# Patient Record
Sex: Male | Born: 1945 | Race: White | Hispanic: No | Marital: Married | Smoking: Former smoker
Health system: Southern US, Community
[De-identification: ages and names within clinical notes are randomized; demographics above are authoritative.]

## PROBLEM LIST (undated history)

## (undated) DIAGNOSIS — F329 Major depressive disorder, single episode, unspecified: Secondary | ICD-10-CM

## (undated) DIAGNOSIS — F32A Depression, unspecified: Secondary | ICD-10-CM

## (undated) DIAGNOSIS — N400 Enlarged prostate without lower urinary tract symptoms: Secondary | ICD-10-CM

## (undated) DIAGNOSIS — E079 Disorder of thyroid, unspecified: Secondary | ICD-10-CM

## (undated) DIAGNOSIS — K219 Gastro-esophageal reflux disease without esophagitis: Secondary | ICD-10-CM

## (undated) DIAGNOSIS — E119 Type 2 diabetes mellitus without complications: Secondary | ICD-10-CM

## (undated) DIAGNOSIS — M109 Gout, unspecified: Secondary | ICD-10-CM

## (undated) DIAGNOSIS — I639 Cerebral infarction, unspecified: Secondary | ICD-10-CM

## (undated) HISTORY — PX: BELOW KNEE LEG AMPUTATION: SUR23

## (undated) HISTORY — PX: CARDIAC SURGERY: SHX584

---

## 2008-09-10 ENCOUNTER — Inpatient Hospital Stay: Payer: Self-pay | Admitting: Vascular Surgery

## 2008-09-26 ENCOUNTER — Inpatient Hospital Stay: Payer: Self-pay | Admitting: Internal Medicine

## 2008-10-09 ENCOUNTER — Inpatient Hospital Stay: Payer: Self-pay | Admitting: Vascular Surgery

## 2008-10-23 ENCOUNTER — Ambulatory Visit: Payer: Self-pay | Admitting: Oncology

## 2008-11-22 ENCOUNTER — Ambulatory Visit: Payer: Self-pay | Admitting: Oncology

## 2008-12-02 ENCOUNTER — Ambulatory Visit: Payer: Self-pay | Admitting: Vascular Surgery

## 2008-12-17 ENCOUNTER — Ambulatory Visit: Payer: Self-pay | Admitting: Family Medicine

## 2009-02-16 ENCOUNTER — Inpatient Hospital Stay: Payer: Self-pay | Admitting: Internal Medicine

## 2009-03-23 ENCOUNTER — Inpatient Hospital Stay: Payer: Self-pay | Admitting: Internal Medicine

## 2009-04-09 ENCOUNTER — Inpatient Hospital Stay: Payer: Self-pay | Admitting: Internal Medicine

## 2009-05-09 ENCOUNTER — Ambulatory Visit: Payer: Self-pay | Admitting: Vascular Surgery

## 2009-05-16 ENCOUNTER — Ambulatory Visit: Payer: Self-pay | Admitting: Vascular Surgery

## 2009-05-19 ENCOUNTER — Inpatient Hospital Stay: Payer: Self-pay | Admitting: Vascular Surgery

## 2009-08-26 ENCOUNTER — Emergency Department: Payer: Self-pay | Admitting: Emergency Medicine

## 2009-09-12 ENCOUNTER — Emergency Department: Payer: Self-pay | Admitting: Emergency Medicine

## 2009-09-15 ENCOUNTER — Inpatient Hospital Stay: Payer: Self-pay | Admitting: Vascular Surgery

## 2009-10-18 ENCOUNTER — Ambulatory Visit: Payer: Self-pay | Admitting: Internal Medicine

## 2009-12-03 IMAGING — CT CT EXTREM LOW W/O CM*L*
2 of 3 series · 14 of 42 positions shown, 18 images · non-contrast
Comparison: None

REASON FOR EXAM: left groin infection s/p ileofemoral bypass
COMMENTS:

PROCEDURE:     CT  - CT FEMUR/ THIGH LEFT WO  - October 10, 2008 [DATE]
RESULT:     History: Right groin infection
TECHNIQUE: Multiple axial images are obtained of the pelvis and proximal two
thirds of the left femur without intravenous contrast. Sagittal and coronal
reformatted images are provided.

[Series 1: coronal femur · coronal · 0.99mm/px · 3 of 95 slices shown]
[im 32/95  soft-tissue]
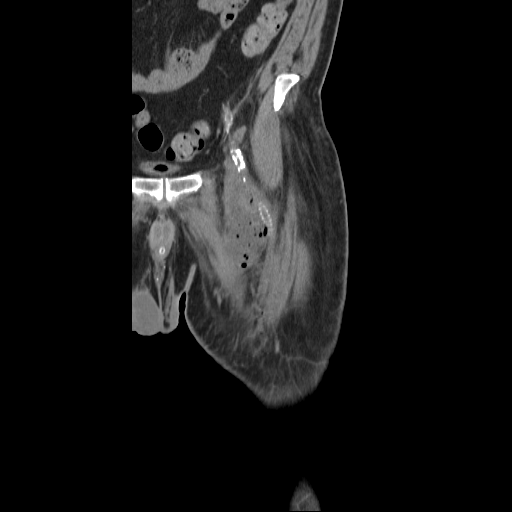
[im 42/95  soft-tissue]
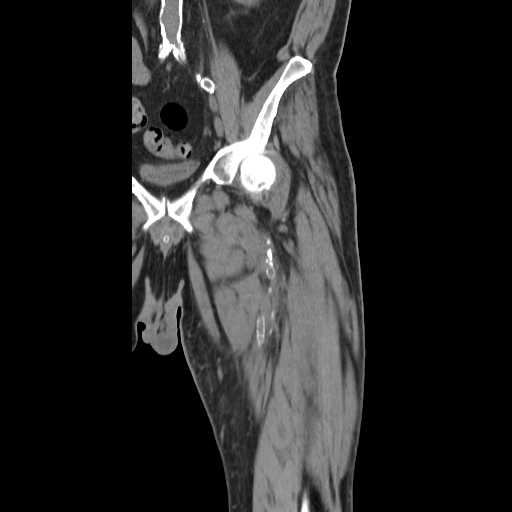
[im 53/95  soft-tissue]
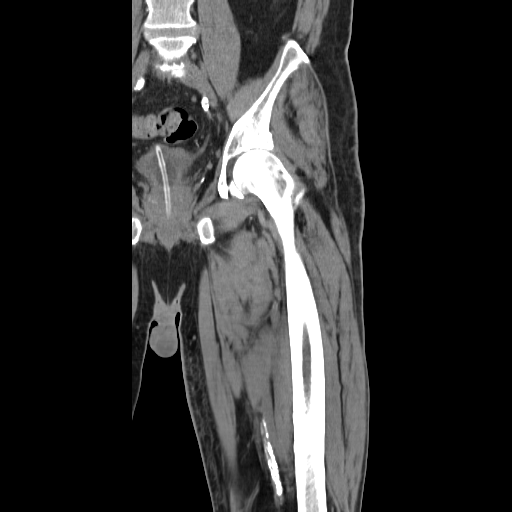

[Series 2: webspace femur left · axial · 0.48mm/px · z∈[-853,-412]mm · 11 of 338 slices shown, 15 images]
[im 29/338  soft-tissue]
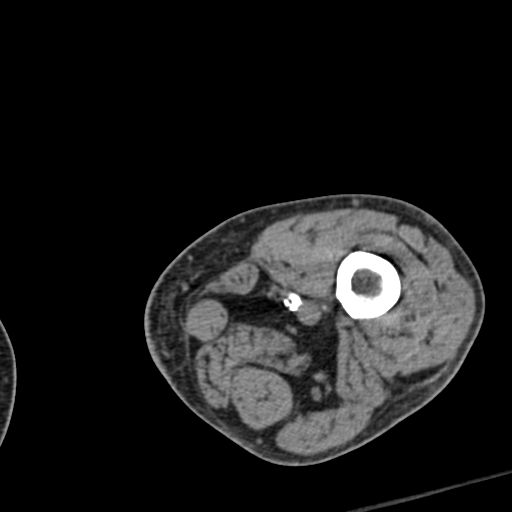
[im 29/338  bone]
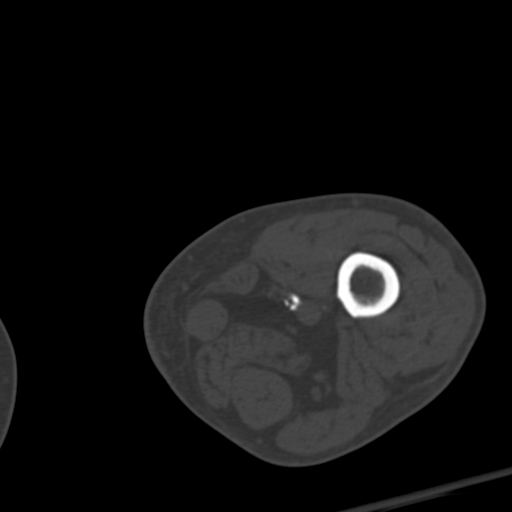
[im 57/338  soft-tissue]
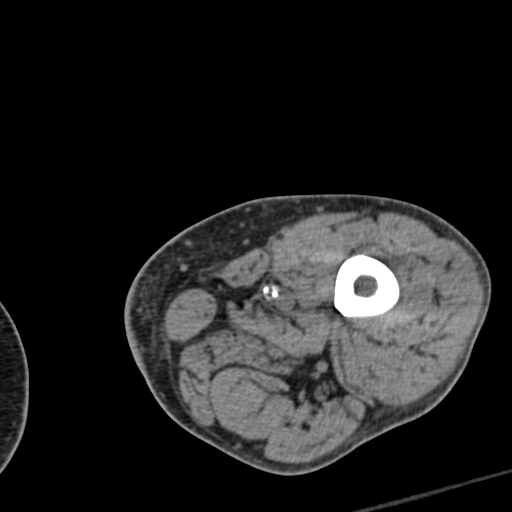
[im 99/338  soft-tissue]
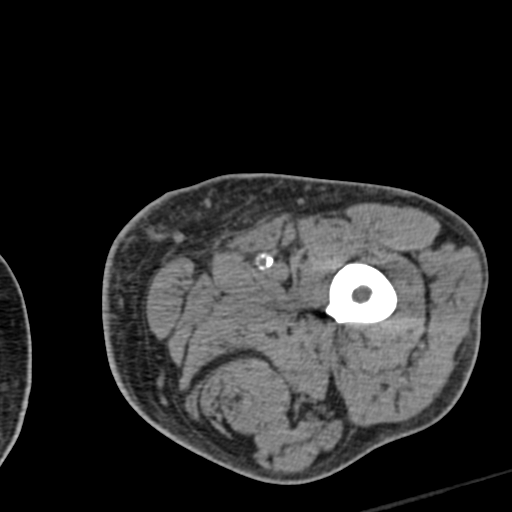
[im 127/338  soft-tissue]
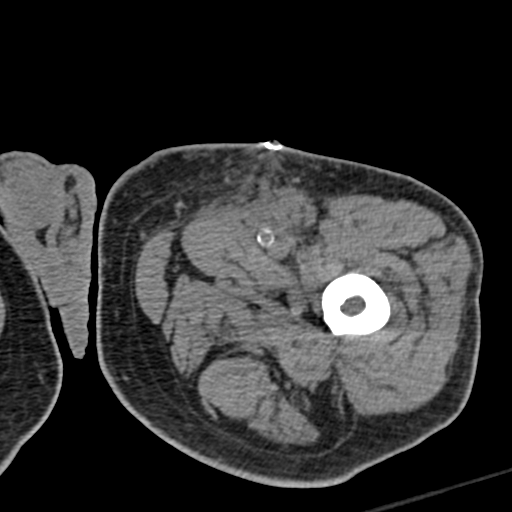
[im 169/338  soft-tissue]
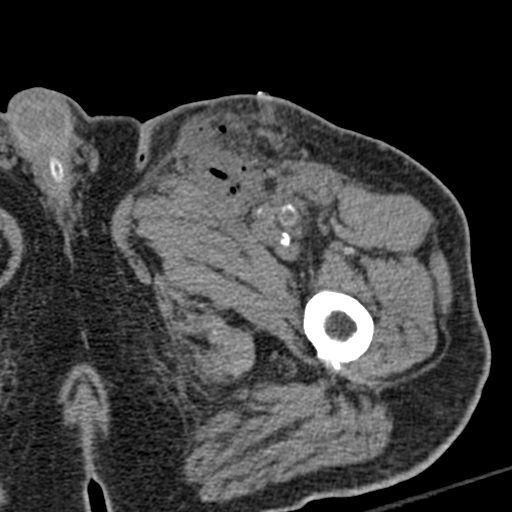
[im 211/338  soft-tissue]
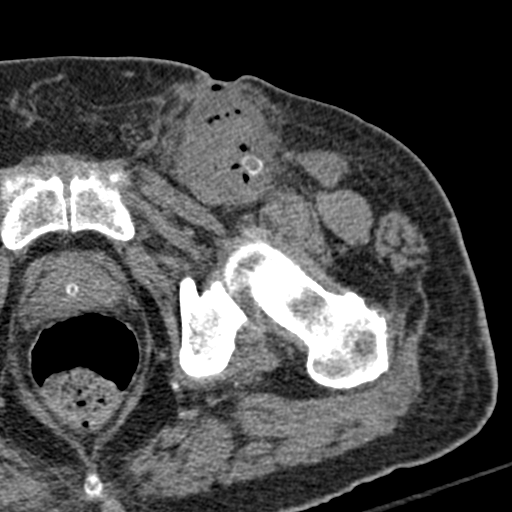
[im 239/338  soft-tissue]
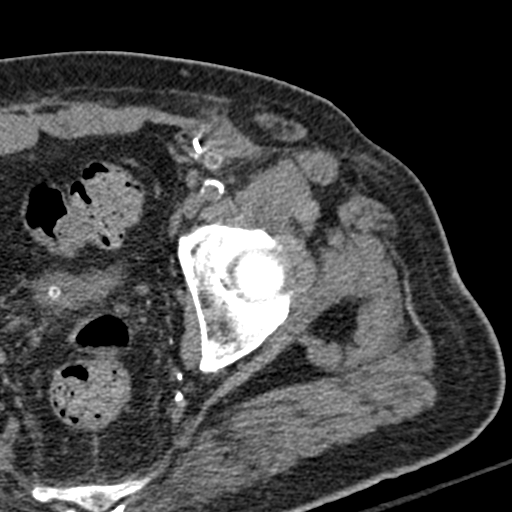
[im 281/338  soft-tissue]
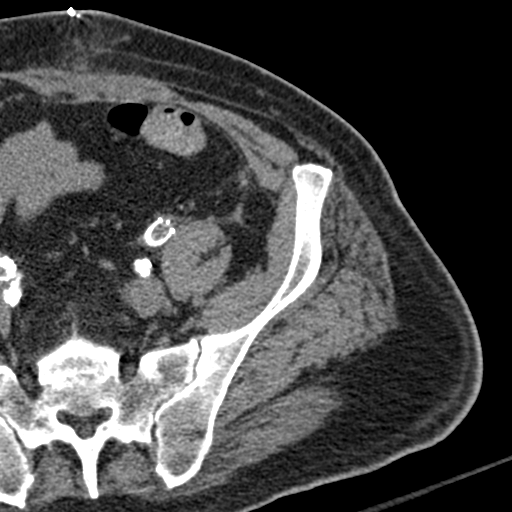
[im 281/338  lung]
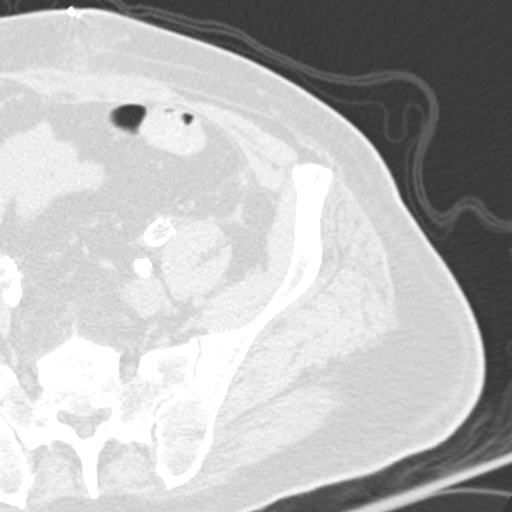
[im 295/338  lung]
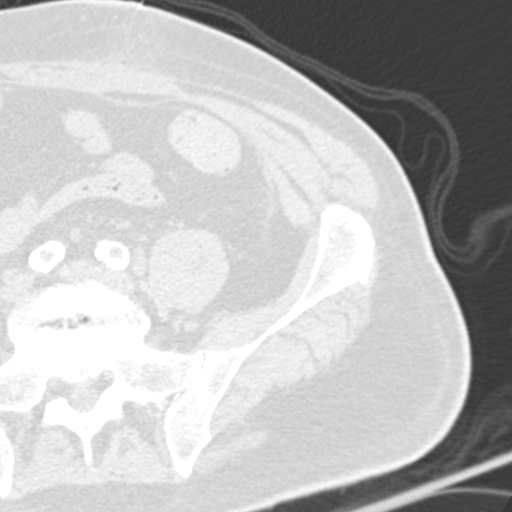
[im 309/338  soft-tissue]
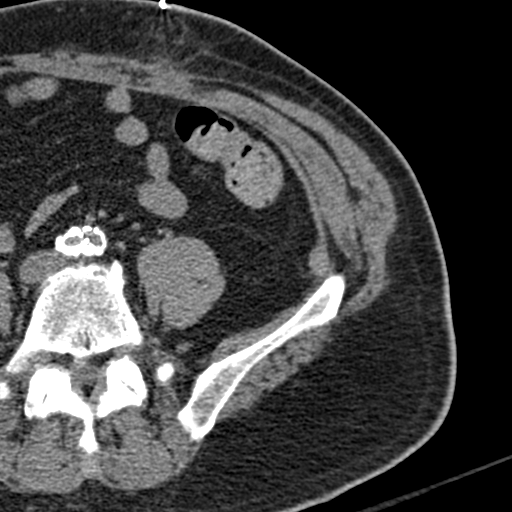
[im 309/338  lung]
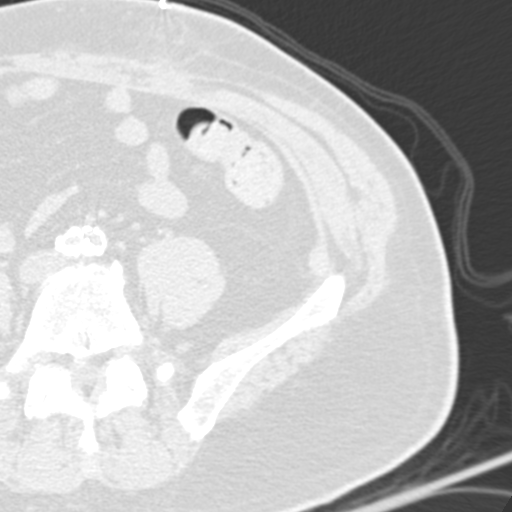
[im 309/338  bone]
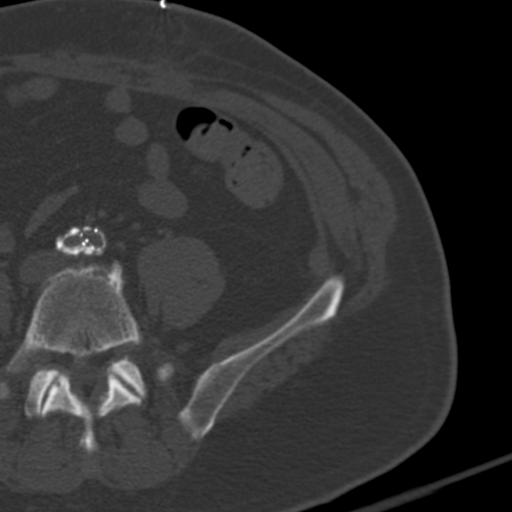
[im 323/338  lung]
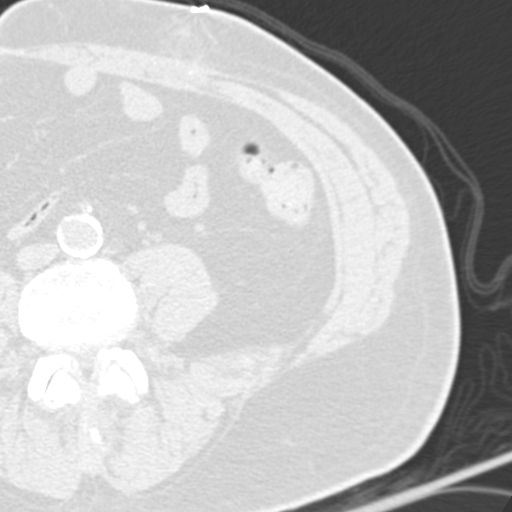

[14 of 42 positions shown; findings below may reference images not displayed]

FINDINGS: There is a 12.3 x 5.8 x 6.4 cm complex fluid collection within the left
groin extending inferiorly to just below the level of the lesser trochanter.
The area of concern surrounds the left femoral bypass graft. There is air
within the collection. There is an overlying skin defect. There mild
surrounding inflammatory changes. There is inflammatory change extending
inferiorly along the right anterior thigh with a few locules of air seen
within the subcutaneous fat superficial to the fascia . There is air seen
within the inferior portion of the left rectus muscle.

There is abdominal aortic atherosclerosis. There is bilateral common iliac
artery atherosclerosis. There is right external iliac artery stenting. There
are postsurgical changes in the region of the right groin without a focal
fluid collection. There is a right femoral bypass graft noted.
The osseous structures are unremarkable. There is no bone destruction. There
is lower lumbar spine spondylosis. There are bilateral mild degenerative
changes of the hips.

There is a Foley catheter present within the bladder.
IMPRESSION: 1. There is a 12.3 x 5.8 x 6.4 cm complex fluid collection within the left
groin extending inferiorly to just below the level of the lesser trochanter.
The area of concern surrounds the left femoral bypass graft. There is air
within the collection. There is an overlying skin defect. The overall
appearance is concerning for an abscess.

2. There is a right femoral bypass graft noted. There are inflammatory
changes in the right groin likely postsurgical changes. There is no right
groin fluid collection to suggest an abscess.

## 2010-02-19 ENCOUNTER — Inpatient Hospital Stay: Payer: Self-pay | Admitting: Internal Medicine

## 2010-05-18 IMAGING — CR RIGHT FOOT COMPLETE - 3+ VIEW
1 series · 3 of 3 positions shown · non-contrast
Comparison: none

REASON FOR EXAM: ulcer, PVD, possible osteomyelitis
COMMENTS:

PROCEDURE:     DXR - DXR FOOT RT COMPLETE W/OBLIQUES  - March 25, 2009  [DATE]
RESULT:     There is no evidence of fracture, dislocation or malalignment.
There does not appear to be evidence of subcutaneous emphysema. There is no
evidence of cortical irregularity.

[Series 1: view not recorded · 0.17mm/px · 3 of 3 slices shown]
[im 1/3]
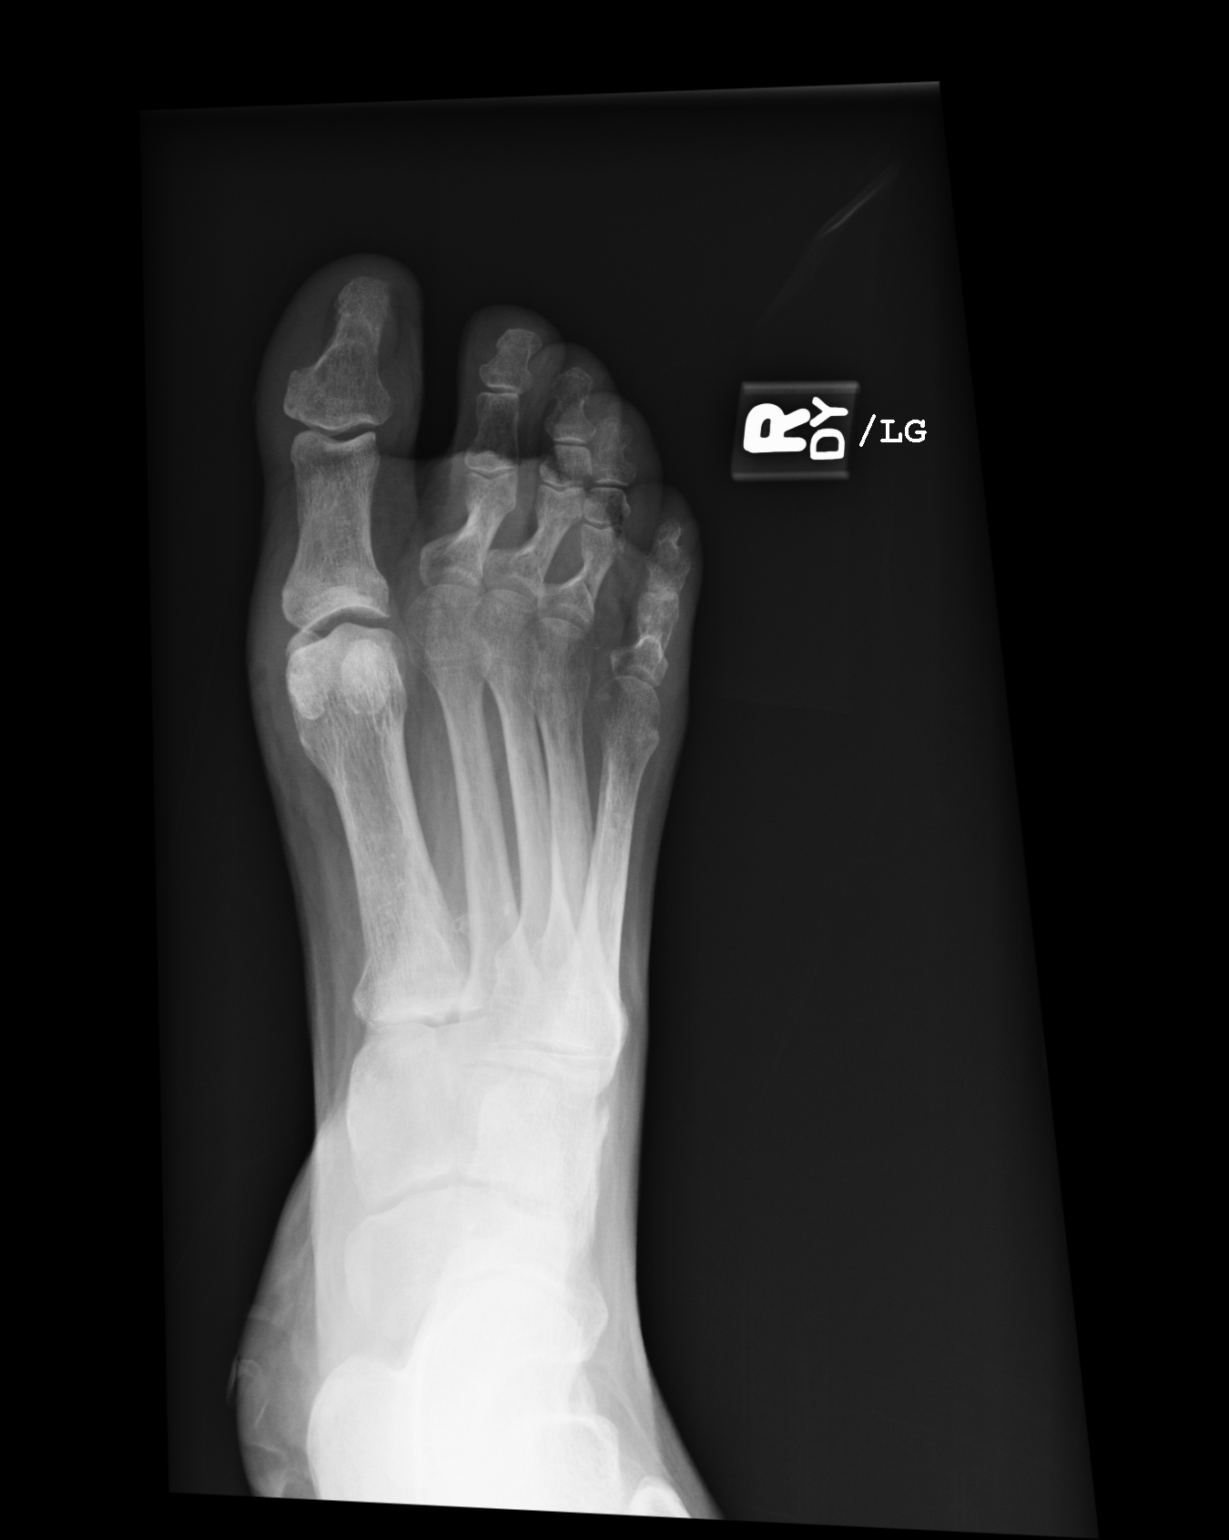
[im 2/3]
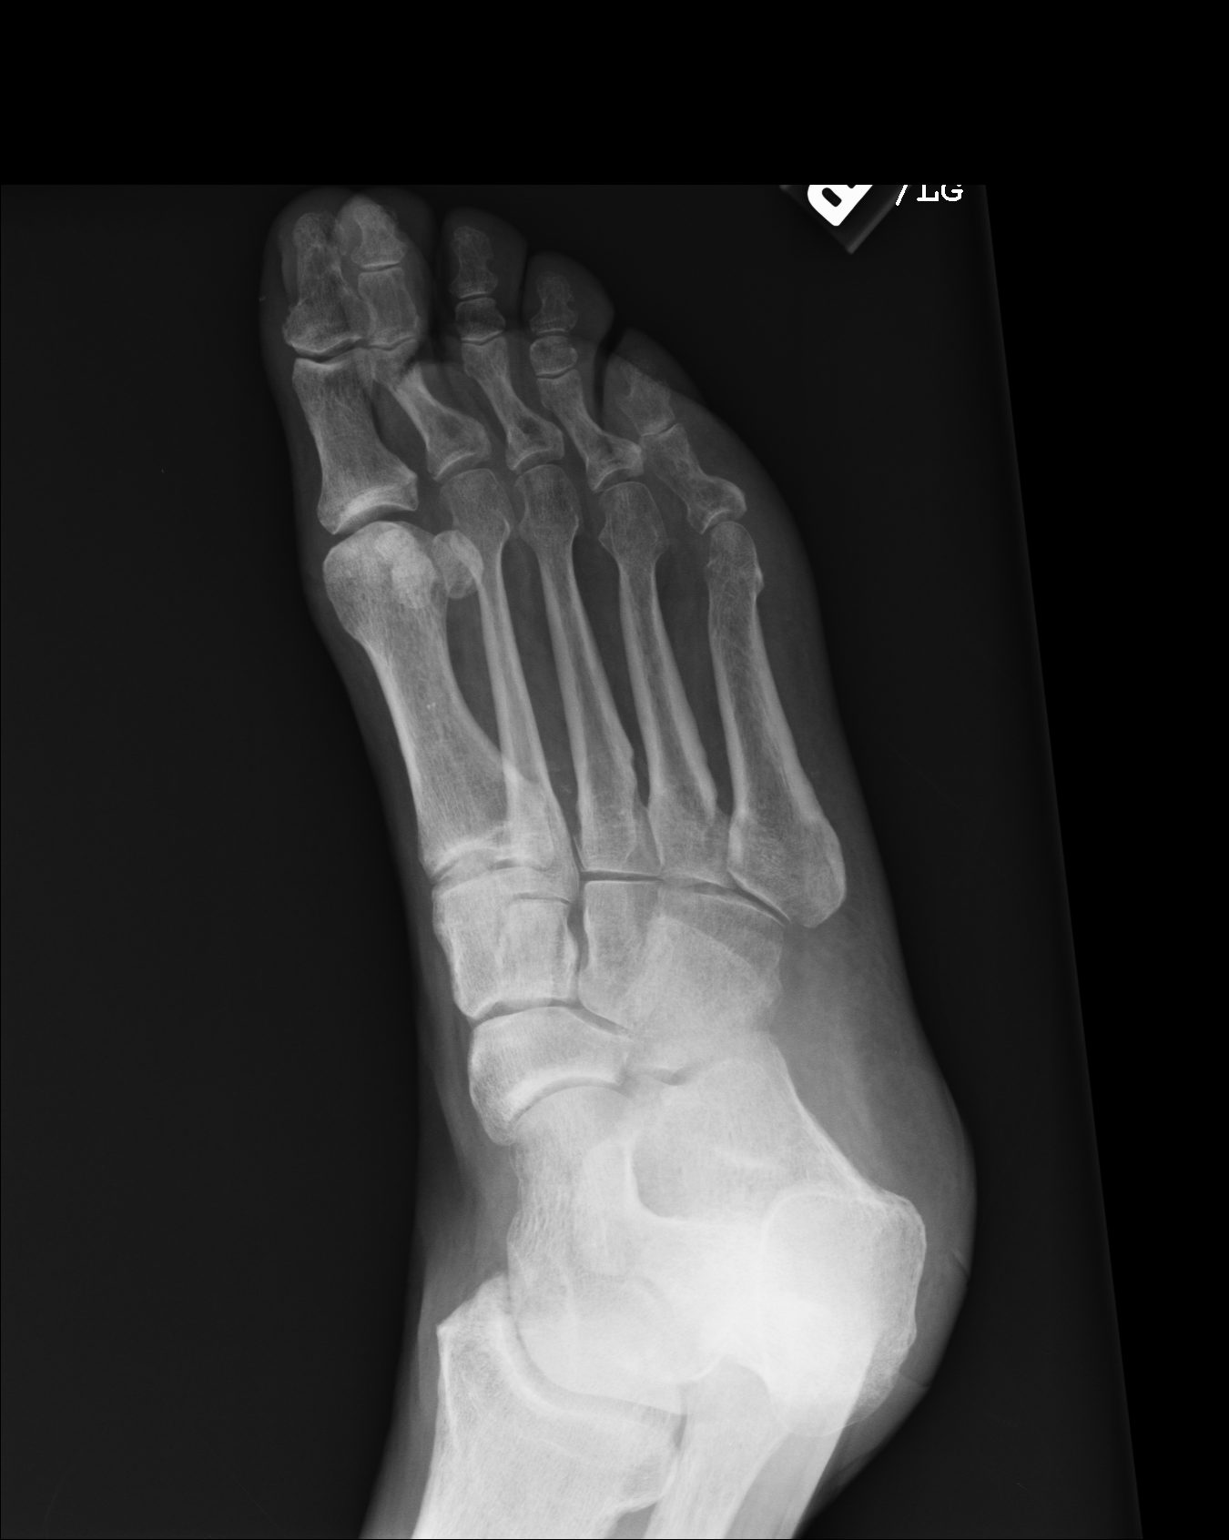
[im 3/3]
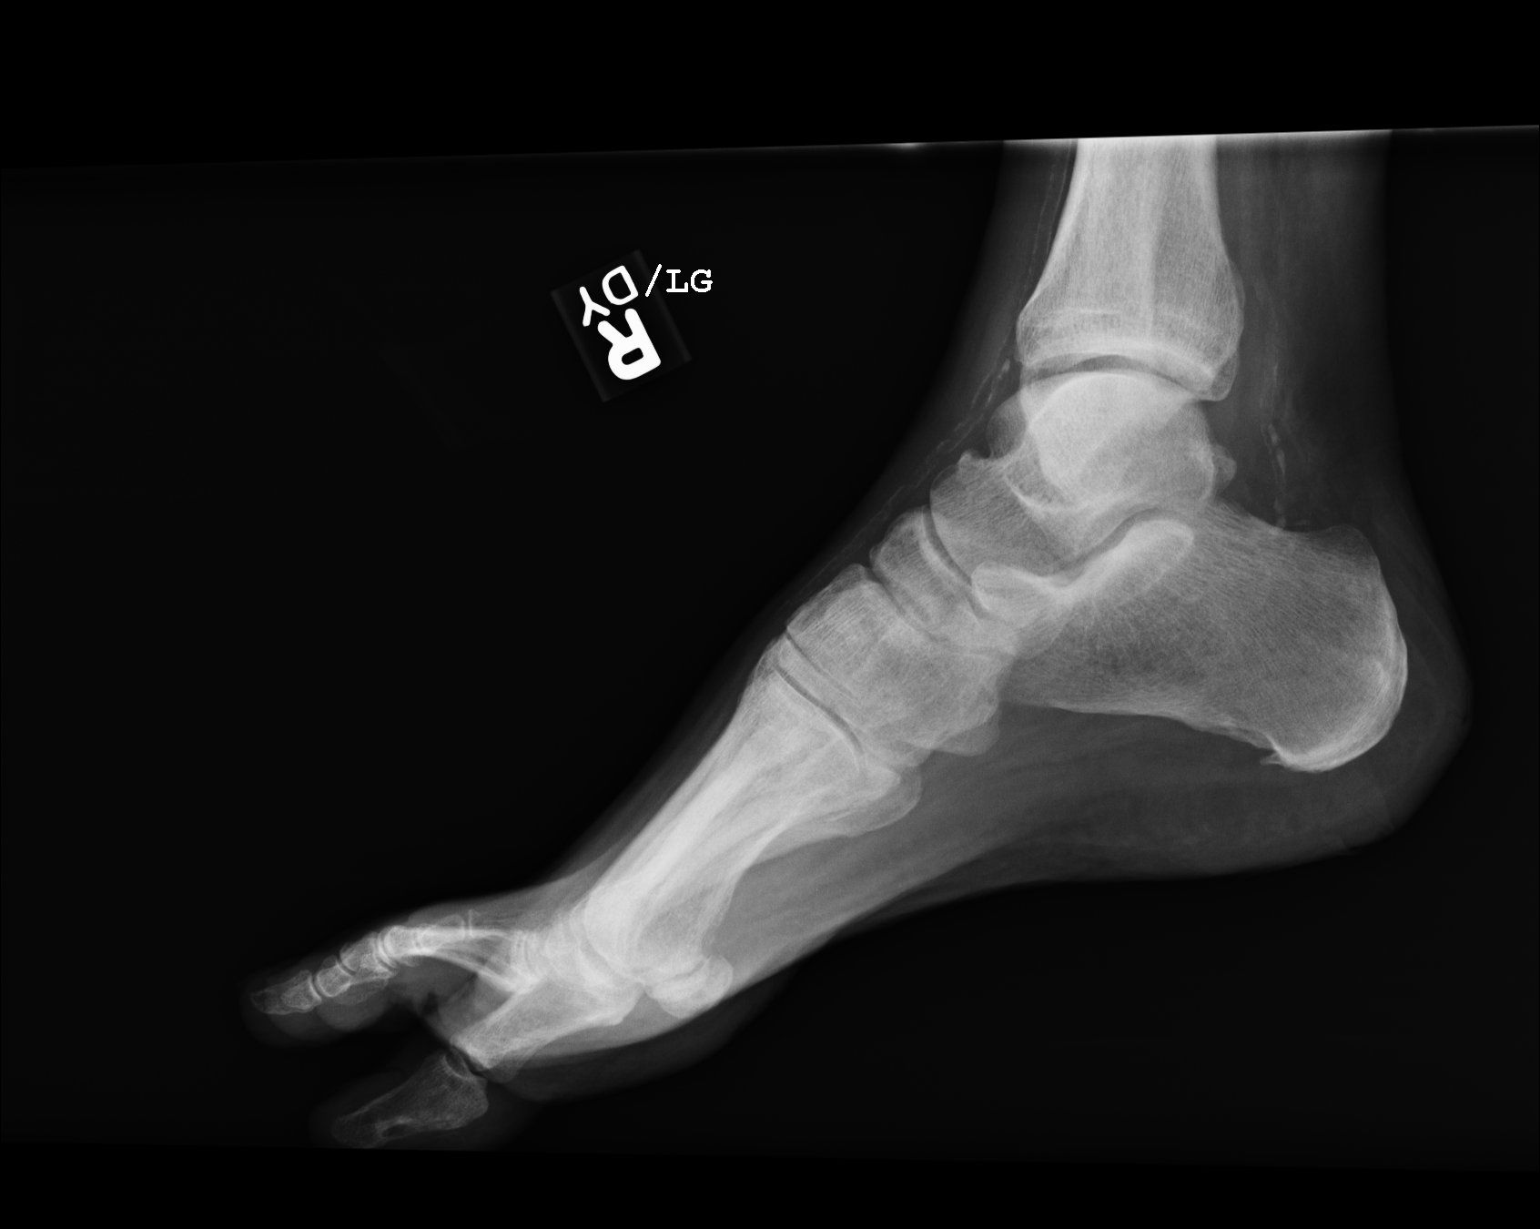

[3 of 3 positions shown; findings below may reference images not displayed]

IMPRESSION: No evidence of acute osseous abnormalities. If there is persistent clinical
concern, further evaluation with MRI is recommended.

## 2010-07-30 ENCOUNTER — Inpatient Hospital Stay: Payer: Self-pay | Admitting: Internal Medicine

## 2012-04-04 ENCOUNTER — Other Ambulatory Visit: Payer: Self-pay

## 2012-04-04 LAB — PROTIME-INR: INR: 1.8

## 2012-04-11 ENCOUNTER — Other Ambulatory Visit: Payer: Self-pay

## 2012-04-11 LAB — PROTIME-INR: Prothrombin Time: 23.7 secs — ABNORMAL HIGH (ref 11.5–14.7)

## 2012-06-12 ENCOUNTER — Emergency Department: Payer: Self-pay | Admitting: Emergency Medicine

## 2012-06-12 LAB — COMPREHENSIVE METABOLIC PANEL
Albumin: 3.1 g/dL — ABNORMAL LOW (ref 3.4–5.0)
BUN: 47 mg/dL — ABNORMAL HIGH (ref 7–18)
Bilirubin,Total: 2.5 mg/dL — ABNORMAL HIGH (ref 0.2–1.0)
Calcium, Total: 8.5 mg/dL (ref 8.5–10.1)
Co2: 25 mmol/L (ref 21–32)
Creatinine: 1.61 mg/dL — ABNORMAL HIGH (ref 0.60–1.30)
EGFR (Non-African Amer.): 44 — ABNORMAL LOW
Potassium: 4.4 mmol/L (ref 3.5–5.1)
Sodium: 139 mmol/L (ref 136–145)

## 2012-06-12 LAB — TSH: Thyroid Stimulating Horm: 4.88 u[IU]/mL — ABNORMAL HIGH

## 2012-06-12 LAB — CBC WITH DIFFERENTIAL/PLATELET
Basophil #: 0.1 10*3/uL (ref 0.0–0.1)
Basophil %: 0.9 %
Eosinophil %: 2.6 %
HCT: 33.6 % — ABNORMAL LOW (ref 40.0–52.0)
HGB: 11.4 g/dL — ABNORMAL LOW (ref 13.0–18.0)
Lymphocyte #: 0.8 10*3/uL — ABNORMAL LOW (ref 1.0–3.6)
Lymphocyte %: 10.2 %
MCH: 29.5 pg (ref 26.0–34.0)
MCHC: 33.9 g/dL (ref 32.0–36.0)
Monocyte %: 7.3 %
Neutrophil #: 5.9 10*3/uL (ref 1.4–6.5)
Neutrophil %: 79 %
RDW: 19.7 % — ABNORMAL HIGH (ref 11.5–14.5)

## 2012-06-12 LAB — PRO B NATRIURETIC PEPTIDE: B-Type Natriuretic Peptide: 1894 pg/mL — ABNORMAL HIGH (ref 0–125)

## 2012-06-12 LAB — TROPONIN I: Troponin-I: <0.02 ng/mL

## 2013-10-09 ENCOUNTER — Other Ambulatory Visit: Payer: Self-pay | Admitting: Family Medicine

## 2013-10-09 LAB — URINALYSIS, COMPLETE
Bilirubin,UR: NEGATIVE
Glucose,UR: >=500 mg/dL (ref 0–75)
Ketone: NEGATIVE
NITRITE: NEGATIVE
Ph: 7 (ref 4.5–8.0)
Protein: 100
RBC,UR: 3828 /HPF (ref 0–5)
Specific Gravity: 1.013 (ref 1.003–1.030)
Squamous Epithelial: NONE SEEN
WBC UR: 270 /HPF (ref 0–5)

## 2013-10-12 LAB — URINE CULTURE

## 2014-07-11 ENCOUNTER — Other Ambulatory Visit
Admission: RE | Admit: 2014-07-11 | Discharge: 2014-07-11 | Disposition: A | Discharge status: Home / Self Care | Source: Skilled Nursing Facility | Attending: Physician Assistant | Admitting: Physician Assistant

## 2014-07-11 DIAGNOSIS — I638 Other cerebral infarction: Secondary | ICD-10-CM | POA: Diagnosis not present

## 2014-07-11 LAB — PROTIME-INR
INR: 10.84
PROTHROMBIN TIME: 81.9 s — AB (ref 11.4–15.0)

## 2014-07-14 ENCOUNTER — Other Ambulatory Visit
Admission: RE | Admit: 2014-07-14 | Discharge: 2014-07-14 | Disposition: A | Discharge status: Home / Self Care | Payer: Medicare Other | Source: Ambulatory Visit | Attending: Family Medicine | Admitting: Family Medicine

## 2014-07-14 DIAGNOSIS — Z7902 Long term (current) use of antithrombotics/antiplatelets: Secondary | ICD-10-CM | POA: Diagnosis present

## 2014-07-14 LAB — PROTIME-INR
INR: 1.36
PROTHROMBIN TIME: 17 s — AB (ref 11.4–15.0)

## 2014-07-21 ENCOUNTER — Other Ambulatory Visit
Admission: RE | Admit: 2014-07-21 | Discharge: 2014-07-21 | Disposition: A | Discharge status: Home / Self Care | Payer: Self-pay | Source: Ambulatory Visit | Attending: Physician Assistant | Admitting: Physician Assistant

## 2014-07-21 DIAGNOSIS — Z5181 Encounter for therapeutic drug level monitoring: Secondary | ICD-10-CM | POA: Insufficient documentation

## 2014-07-21 DIAGNOSIS — Z7901 Long term (current) use of anticoagulants: Secondary | ICD-10-CM | POA: Insufficient documentation

## 2014-07-21 LAB — PROTIME-INR
INR: 2.13
Prothrombin Time: 24 seconds — ABNORMAL HIGH (ref 11.4–15.0)

## 2014-10-20 ENCOUNTER — Other Ambulatory Visit
Admission: RE | Admit: 2014-10-20 | Discharge: 2014-10-20 | Disposition: A | Discharge status: Home / Self Care | Source: Ambulatory Visit | Attending: Physician Assistant | Admitting: Physician Assistant

## 2014-10-20 DIAGNOSIS — I4891 Unspecified atrial fibrillation: Secondary | ICD-10-CM | POA: Diagnosis present

## 2014-10-20 LAB — PROTIME-INR
INR: 1.55
PROTHROMBIN TIME: 18.8 s — AB (ref 11.4–15.0)

## 2014-11-19 ENCOUNTER — Other Ambulatory Visit
Admit: 2014-11-19 | Discharge: 2014-11-19 | Disposition: A | Discharge status: Home / Self Care | Source: Skilled Nursing Facility | Attending: Family Medicine | Admitting: Family Medicine

## 2014-11-19 DIAGNOSIS — R300 Dysuria: Secondary | ICD-10-CM | POA: Insufficient documentation

## 2014-11-19 LAB — URINALYSIS COMPLETE WITH MICROSCOPIC (ARMC ONLY)
Bilirubin Urine: NEGATIVE
GLUCOSE, UA: NEGATIVE mg/dL
Ketones, ur: NEGATIVE mg/dL
Nitrite: NEGATIVE
Protein, ur: NEGATIVE mg/dL
SPECIFIC GRAVITY, URINE: 1.006 (ref 1.005–1.030)
SQUAMOUS EPITHELIAL / LPF: NONE SEEN
pH: 8 (ref 5.0–8.0)

## 2014-11-21 LAB — URINE CULTURE

## 2014-12-07 ENCOUNTER — Other Ambulatory Visit
Admission: RE | Admit: 2014-12-07 | Discharge: 2014-12-07 | Disposition: A | Discharge status: Home / Self Care | Source: Ambulatory Visit | Attending: Family Medicine | Admitting: Family Medicine

## 2014-12-07 DIAGNOSIS — Z794 Long term (current) use of insulin: Secondary | ICD-10-CM | POA: Insufficient documentation

## 2014-12-07 DIAGNOSIS — I509 Heart failure, unspecified: Secondary | ICD-10-CM | POA: Diagnosis not present

## 2014-12-07 DIAGNOSIS — M109 Gout, unspecified: Secondary | ICD-10-CM | POA: Diagnosis not present

## 2014-12-07 DIAGNOSIS — I10 Essential (primary) hypertension: Secondary | ICD-10-CM | POA: Insufficient documentation

## 2014-12-07 DIAGNOSIS — N400 Enlarged prostate without lower urinary tract symptoms: Secondary | ICD-10-CM | POA: Insufficient documentation

## 2014-12-07 DIAGNOSIS — Z436 Encounter for attention to other artificial openings of urinary tract: Secondary | ICD-10-CM | POA: Diagnosis not present

## 2014-12-07 DIAGNOSIS — E1140 Type 2 diabetes mellitus with diabetic neuropathy, unspecified: Secondary | ICD-10-CM | POA: Diagnosis present

## 2014-12-07 DIAGNOSIS — I2510 Atherosclerotic heart disease of native coronary artery without angina pectoris: Secondary | ICD-10-CM | POA: Insufficient documentation

## 2014-12-07 DIAGNOSIS — F329 Major depressive disorder, single episode, unspecified: Secondary | ICD-10-CM | POA: Diagnosis not present

## 2014-12-07 DIAGNOSIS — E039 Hypothyroidism, unspecified: Secondary | ICD-10-CM | POA: Insufficient documentation

## 2014-12-07 DIAGNOSIS — E785 Hyperlipidemia, unspecified: Secondary | ICD-10-CM | POA: Insufficient documentation

## 2014-12-07 DIAGNOSIS — Z7901 Long term (current) use of anticoagulants: Secondary | ICD-10-CM | POA: Diagnosis not present

## 2014-12-07 DIAGNOSIS — K219 Gastro-esophageal reflux disease without esophagitis: Secondary | ICD-10-CM | POA: Diagnosis not present

## 2014-12-07 LAB — PROTIME-INR
INR: 2.19
PROTHROMBIN TIME: 24.5 s — AB (ref 11.4–15.0)

## 2015-01-21 ENCOUNTER — Encounter: Payer: Self-pay | Admitting: Emergency Medicine

## 2015-01-21 ENCOUNTER — Emergency Department
Admission: EM | Admit: 2015-01-21 | Discharge: 2015-01-21 | Disposition: A | Discharge status: Home / Self Care | Payer: Medicare Other | Attending: Emergency Medicine | Admitting: Emergency Medicine

## 2015-01-21 DIAGNOSIS — Z905 Acquired absence of kidney: Secondary | ICD-10-CM | POA: Diagnosis not present

## 2015-01-21 DIAGNOSIS — Y658 Other specified misadventures during surgical and medical care: Secondary | ICD-10-CM | POA: Diagnosis not present

## 2015-01-21 DIAGNOSIS — F329 Major depressive disorder, single episode, unspecified: Secondary | ICD-10-CM | POA: Diagnosis not present

## 2015-01-21 DIAGNOSIS — Z89511 Acquired absence of right leg below knee: Secondary | ICD-10-CM | POA: Diagnosis not present

## 2015-01-21 DIAGNOSIS — E119 Type 2 diabetes mellitus without complications: Secondary | ICD-10-CM | POA: Insufficient documentation

## 2015-01-21 DIAGNOSIS — Z87891 Personal history of nicotine dependence: Secondary | ICD-10-CM | POA: Diagnosis not present

## 2015-01-21 DIAGNOSIS — Z89512 Acquired absence of left leg below knee: Secondary | ICD-10-CM | POA: Diagnosis not present

## 2015-01-21 DIAGNOSIS — T83098A Other mechanical complication of other indwelling urethral catheter, initial encounter: Secondary | ICD-10-CM

## 2015-01-21 DIAGNOSIS — T83012A Breakdown (mechanical) of nephrostomy catheter, initial encounter: Secondary | ICD-10-CM | POA: Insufficient documentation

## 2015-01-21 HISTORY — DX: Gout, unspecified: M10.9

## 2015-01-21 HISTORY — DX: Cerebral infarction, unspecified: I63.9

## 2015-01-21 HISTORY — DX: Benign prostatic hyperplasia without lower urinary tract symptoms: N40.0

## 2015-01-21 HISTORY — DX: Major depressive disorder, single episode, unspecified: F32.9

## 2015-01-21 HISTORY — DX: Disorder of thyroid, unspecified: E07.9

## 2015-01-21 HISTORY — DX: Type 2 diabetes mellitus without complications: E11.9

## 2015-01-21 HISTORY — DX: Depression, unspecified: F32.A

## 2015-01-21 HISTORY — DX: Gastro-esophageal reflux disease without esophagitis: K21.9

## 2015-01-21 LAB — URINALYSIS COMPLETE WITH MICROSCOPIC (ARMC ONLY)
Bilirubin Urine: NEGATIVE
GLUCOSE, UA: NEGATIVE mg/dL
Ketones, ur: NEGATIVE mg/dL
NITRITE: NEGATIVE
PROTEIN: NEGATIVE mg/dL
Specific Gravity, Urine: 1.008 (ref 1.005–1.030)
pH: 6 (ref 5.0–8.0)

## 2015-01-21 LAB — CBC
HCT: 40.5 % (ref 40.0–52.0)
HEMOGLOBIN: 13.6 g/dL (ref 13.0–18.0)
MCH: 28.8 pg (ref 26.0–34.0)
MCHC: 33.5 g/dL (ref 32.0–36.0)
MCV: 85.7 fL (ref 80.0–100.0)
PLATELETS: 246 10*3/uL (ref 150–440)
RBC: 4.73 MIL/uL (ref 4.40–5.90)
RDW: 18.9 % — ABNORMAL HIGH (ref 11.5–14.5)
WBC: 9.7 10*3/uL (ref 3.8–10.6)

## 2015-01-21 LAB — BASIC METABOLIC PANEL
Anion gap: 7 (ref 5–15)
BUN: 36 mg/dL — ABNORMAL HIGH (ref 6–20)
CALCIUM: 9 mg/dL (ref 8.9–10.3)
CHLORIDE: 101 mmol/L (ref 101–111)
CO2: 29 mmol/L (ref 22–32)
CREATININE: 1.27 mg/dL — AB (ref 0.61–1.24)
GFR, EST NON AFRICAN AMERICAN: 56 mL/min — AB (ref >60)
Glucose, Bld: 135 mg/dL — ABNORMAL HIGH (ref 65–99)
Potassium: 4.9 mmol/L (ref 3.5–5.1)
SODIUM: 137 mmol/L (ref 135–145)

## 2015-01-21 MED ORDER — SODIUM CHLORIDE 0.9 % IV BOLUS (SEPSIS)
500.0000 mL | Freq: Once | INTRAVENOUS | Status: AC
  Administered 2015-01-21: 500 mL via INTRAVENOUS

## 2015-01-21 NOTE — ED Provider Notes (Addendum)
[facility] Emergency Department Provider Note  ____________________________________________  Time seen: Approximately 1:00 PM  I have reviewed the triage vital signs and the nursing notes.   HISTORY  Chief Complaint No chief complaint on file.    HPI Larry Meza is a 69 y.o. male with a history of CVA with residual right hemiparesis, diabetes, bilateral BKA, status post right nephrectomy, chronic left nephrostomy tube presenting with broken tip on nephrostomy tube. Patient states that yesterday he noticed that the external tip of hisnephrostomy tube was cracked. He denies any acute pain, fever, nausea or vomiting. His nephrostomy tube has been turned off since yesterday so it is unclear whether there is still drainage. He does still urinate and has been making urine.   Past Medical History  Diagnosis Date  . Gout   . Diabetes mellitus without complication (HCC)   . Thyroid disease   . Stroke (HCC)   . Depression   . Gastro-esophageal reflux   . Hyperplasia of prostate without lower urinary tract symptoms (LUTS)     There are no active problems to display for this patient.   Past Surgical History  Procedure Laterality Date  . Below knee leg amputation      bilateral  . Cardiac surgery      No current outpatient prescriptions on file.  Allergies Ciprofloxacin and Septra  History reviewed. No pertinent family history.  Social History Social History  Substance Use Topics  . Smoking status: Former Games developermoker  . Smokeless tobacco: None  . Alcohol Use: No    Review of Systems Constitutional: No fever/chills area and no lightheadedness or syncope. Eyes: No visual changes. ENT: No sore throat. Cardiovascular: Denies chest pain, palpitations. Respiratory: Denies shortness of breath.  No cough. Gastrointestinal: No abdominal pain.  No nausea, no vomiting.  No diarrhea.  No constipation. Genitourinary: Positive for broken external tip of  nephrostomy tube. Normal urine output. Musculoskeletal: Negative for back pain. Skin: Negative for rash. Neurological: Negative for headaches, focal weakness or numbness. Psychiatric: Feelings of depression. 10-point ROS otherwise negative.  ____________________________________________   PHYSICAL EXAM:  VITAL SIGNS: ED Triage Vitals  Enc Vitals Group     BP 01/21/15 1213 101/59 mmHg     Pulse Rate 01/21/15 1213 76     Resp 01/21/15 1213 18     Temp 01/21/15 1213 98.5 F (36.9 C)     Temp Source 01/21/15 1213 Oral     SpO2 01/21/15 1213 96 %     Weight 01/21/15 1213 166 lb (75.297 kg)     Height 01/21/15 1213 3\' 6"  (1.067 m)     Head Cir --      Peak Flow --      Pain Score --      Pain Loc --      Pain Edu? --      Excl. in GC? --     Constitutional: Chronically ill-appearing. Alert and oriented. Able to answer questions appropriately but does exhibit significant expressive aphasia.  Eyes: Conjunctivae are normal.  EOMI. Head: Atraumatic. Nose: No congestion/rhinnorhea. Mouth/Throat: Mucous membranes are moist.  Neck: No stridor.  Supple.  Meningismus. Cardiovascular: Normal rate,  Respiratory: Normal respiratory effort.  Gastrointestinal: Abdomen is soft, nontender and nondistended. No guarding or peritoneal signs. BACK: Left-sided nephrostomy tube which is in place with minimal surrounding erythema at the insertion site. Small amount of purulent discharge around the tube. Tip of the nephrostomy tube that is external and adjacent to the stopcock  is broken. The remainder of the tube is sutured in place and appears normal without any cracks or breaking points. Musculoskeletal: Bilateral BKA. Neurologic:  Alert and oriented. Some slurred speech and expressive aphasia. Face is symmetric. Right upper extremity is contracted and paretic. Left upper extremity has normal movement.  Skin:  Skin is warm, dry and intact. No rash noted. Psychiatric: Mood and affect are slightly  depressed with expression of difficulty "getting anything done.". Speech and behavior are normal.  Normal judgement.**}  ____________________________________________   LABS (all labs ordered are listed, but only abnormal results are displayed)  Labs Reviewed  CBC - Abnormal; Notable for the following:    RDW 18.9 (*)    All other components within normal limits  BASIC METABOLIC PANEL - Abnormal; Notable for the following:    Glucose, Bld 135 (*)    BUN 36 (*)    Creatinine, Ser 1.27 (*)    GFR calc non Af Amer 56 (*)    All other components within normal limits  URINALYSIS COMPLETEWITH MICROSCOPIC ([facility] ONLY) - Abnormal; Notable for the following:    Color, Urine YELLOW (*)    APPearance CLOUDY (*)    Hgb urine dipstick 1+ (*)    Leukocytes, UA 3+ (*)    Bacteria, UA RARE (*)    Squamous Epithelial / LPF 0-5 (*)    All other components within normal limits  URINE CULTURE   ____________________________________________  EKG  Not indicated ____________________________________________  RADIOLOGY  No results found.  ____________________________________________   PROCEDURES  Procedure(s) performed: None  Critical Care performed: No ____________________________________________   INITIAL IMPRESSION / ASSESSMENT AND PLAN / ED COURSE  Pertinent labs & imaging results that were available during my care of the patient were reviewed by me and considered in my medical decision making (see chart for details).  69 y.o. male with multiple comorbidities presenting with breakage at the external tip of the stopcock of the nephrostomy tube. The patient does have some skin changes around the insertion site but the nephrostomy tube is sutured in place and these are likely related to chronic nephrostomy tube rather than an acute infection. I will check his creatinine as well as white blood cell count, we will also check his urine. He is not having symptoms of urinary tract  infection.  ----------------------------------------- 2:28 PM on 01/21/2015 -----------------------------------------  The patient continues to be asymptomatic. His urine has some leukocyte esterase which is likely due to the nephrostomy tube; he has some bacteria and wbc, but this may be due to his nephrostomy tube. He has no nitrites. I have sent the urine for culture but given that he is a symptomatic I will not initiate treatment at this time. The patient's nephrostomy tube has been replaced and is draining clear urine with a small amount of sediment. Patient will be discharged home with close urology follow-up. He understands return precautions as well as follow-up instructions.  ____________________________________________  FINAL CLINICAL IMPRESSION(S) / ED DIAGNOSES  Final diagnoses:  Malfunction of nephrostomy tube (HCC)      NEW MEDICATIONS STARTED DURING THIS VISIT:  New Prescriptions   No medications on file     Rockne Menghini, MD 01/21/15 1429  Rockne Menghini, MD 01/21/15 1431

## 2015-01-21 NOTE — ED Notes (Signed)
[facility] for [facility] needs

## 2015-01-21 NOTE — ED Notes (Signed)
Pt brought to ed via [facility] with c/o his nephrostomy tube broken. [facility] told [facility] that his tube should not be turned of so [place] to be looked at and replaced. Pt is bilateral BKA and unable to form sentences due to previous stroke.

## 2015-01-21 NOTE — Discharge Instructions (Signed)
Please make an appointment with your urologist in the next 1-2 days. Please have your urologist follow up the urine culture that was sent from the emergency department.  Please continue to monitor your nephrostomy bag. Return to the emergency department if you notice decreased output, blood, significant bad odor, or if you develop fever, chills, nausea or vomiting, pain, or any other symptoms concerning to you.

## 2015-01-24 LAB — URINE CULTURE

## 2017-10-23 DEATH — deceased
